# Patient Record
Sex: Male | Born: 1955 | Race: White | Hispanic: No | Marital: Married | Smoking: Never smoker
Health system: Southern US, Community
[De-identification: ages and names within clinical notes are randomized; demographics above are authoritative.]

## PROBLEM LIST (undated history)

## (undated) DIAGNOSIS — N189 Chronic kidney disease, unspecified: Secondary | ICD-10-CM

## (undated) DIAGNOSIS — I10 Essential (primary) hypertension: Secondary | ICD-10-CM

## (undated) DIAGNOSIS — E119 Type 2 diabetes mellitus without complications: Secondary | ICD-10-CM

## (undated) HISTORY — DX: Essential (primary) hypertension: I10

## (undated) HISTORY — DX: Chronic kidney disease, unspecified: N18.9

## (undated) HISTORY — DX: Type 2 diabetes mellitus without complications: E11.9

---

## 2004-05-17 ENCOUNTER — Inpatient Hospital Stay: Payer: Self-pay | Admitting: Internal Medicine

## 2004-07-05 ENCOUNTER — Ambulatory Visit: Payer: Self-pay | Admitting: Internal Medicine

## 2004-07-09 ENCOUNTER — Ambulatory Visit: Payer: Self-pay | Admitting: Orthopaedic Surgery

## 2004-07-25 ENCOUNTER — Other Ambulatory Visit: Payer: Self-pay

## 2004-07-26 ENCOUNTER — Ambulatory Visit: Payer: Self-pay | Admitting: Urology

## 2004-07-28 ENCOUNTER — Inpatient Hospital Stay: Payer: Self-pay | Admitting: Internal Medicine

## 2004-08-05 ENCOUNTER — Ambulatory Visit: Payer: Self-pay | Admitting: Internal Medicine

## 2004-08-23 ENCOUNTER — Ambulatory Visit: Payer: Self-pay | Admitting: Orthopaedic Surgery

## 2004-11-13 ENCOUNTER — Ambulatory Visit: Payer: Self-pay | Admitting: Internal Medicine

## 2005-01-15 ENCOUNTER — Ambulatory Visit: Payer: Self-pay | Admitting: Endocrinology

## 2005-03-06 ENCOUNTER — Other Ambulatory Visit: Payer: Self-pay

## 2005-03-06 ENCOUNTER — Emergency Department: Payer: Self-pay | Admitting: Unknown Physician Specialty

## 2005-06-14 ENCOUNTER — Other Ambulatory Visit: Payer: Self-pay

## 2005-06-14 ENCOUNTER — Inpatient Hospital Stay: Payer: Self-pay | Admitting: Internal Medicine

## 2005-06-15 ENCOUNTER — Other Ambulatory Visit: Payer: Self-pay

## 2005-06-16 ENCOUNTER — Other Ambulatory Visit: Payer: Self-pay

## 2005-06-17 ENCOUNTER — Other Ambulatory Visit: Payer: Self-pay

## 2005-07-01 ENCOUNTER — Ambulatory Visit: Payer: Self-pay | Admitting: Internal Medicine

## 2005-07-10 ENCOUNTER — Ambulatory Visit: Payer: Self-pay | Admitting: Internal Medicine

## 2005-07-10 ENCOUNTER — Other Ambulatory Visit: Payer: Self-pay

## 2005-09-24 ENCOUNTER — Ambulatory Visit: Payer: Self-pay | Admitting: Internal Medicine

## 2005-10-14 ENCOUNTER — Other Ambulatory Visit: Payer: Self-pay

## 2005-10-14 ENCOUNTER — Emergency Department: Payer: Self-pay | Admitting: Emergency Medicine

## 2005-11-19 ENCOUNTER — Ambulatory Visit: Payer: Self-pay | Admitting: Internal Medicine

## 2005-12-17 ENCOUNTER — Ambulatory Visit: Payer: Self-pay | Admitting: Gastroenterology

## 2006-09-04 ENCOUNTER — Ambulatory Visit: Payer: Self-pay | Admitting: Orthopaedic Surgery

## 2006-10-01 ENCOUNTER — Ambulatory Visit: Payer: Self-pay | Admitting: Orthopaedic Surgery

## 2006-12-01 IMAGING — CT CT ABDOMEN WO/W CM
2 of 4 series · 14 of 32 positions shown, 19 images · non-contrast
Comparison: none

REASON FOR EXAM: adrenal tumor   HTN
COMMENTS:

[Series 2: without · axial · non-contrast · 0.75mm/px · z∈[-822,-582]mm · 8 of 40 slices shown, 13 images]
[im 5/40  soft-tissue]
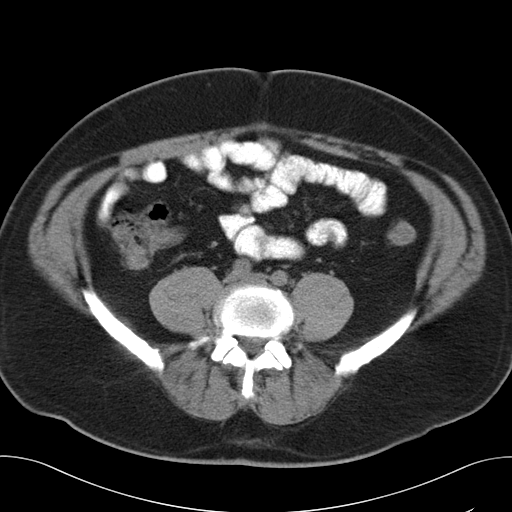
[im 5/40  bone]
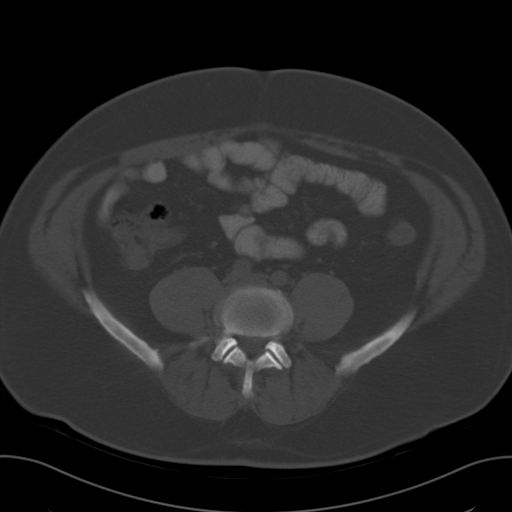
[im 9/40  soft-tissue]
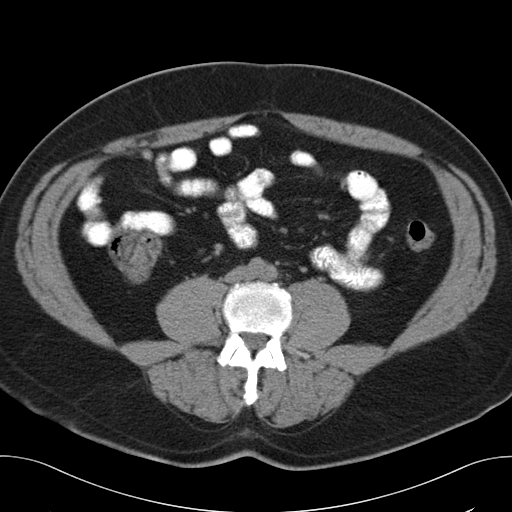
[im 14/40  soft-tissue]
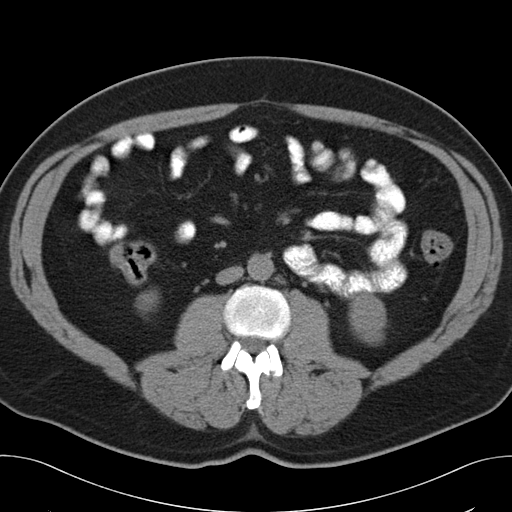
[im 18/40  soft-tissue]
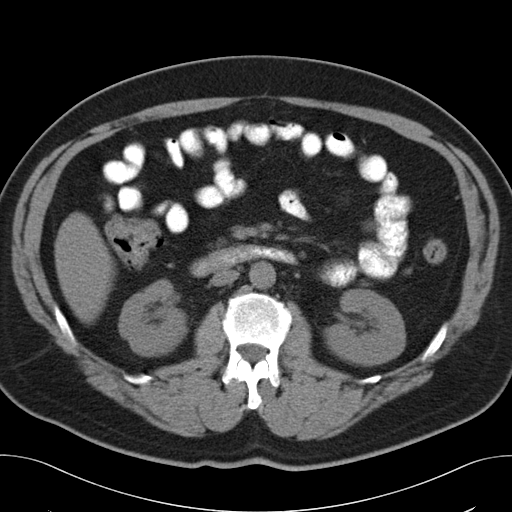
[im 22/40  soft-tissue]
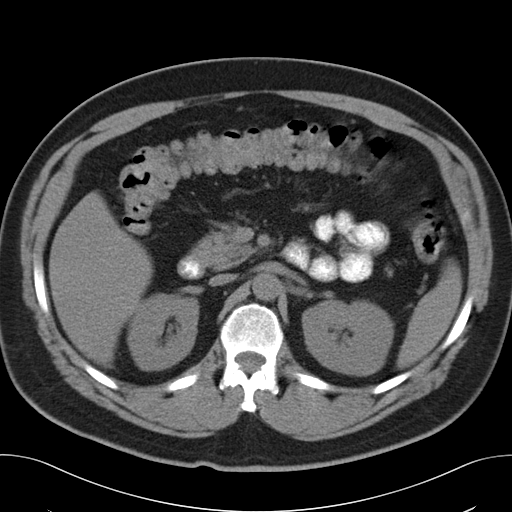
[im 22/40  lung]
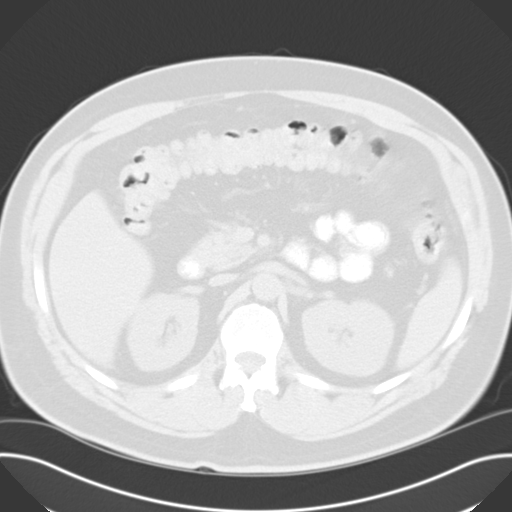
[im 27/40  soft-tissue]
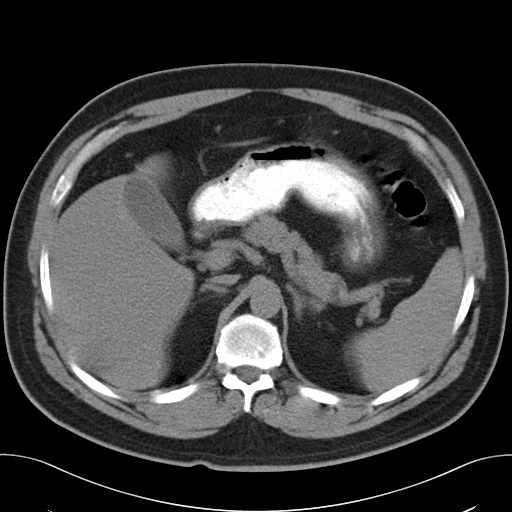
[im 27/40  lung]
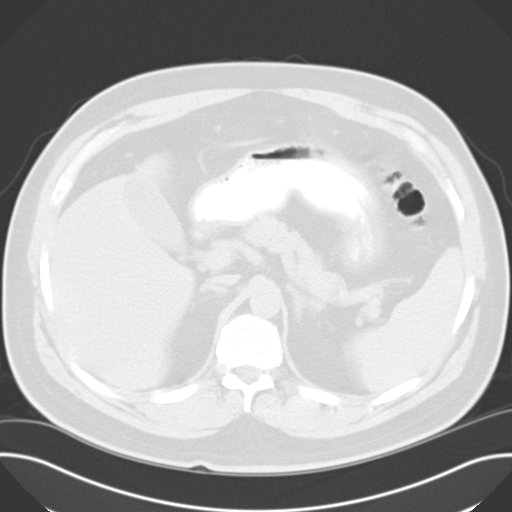
[im 31/40  soft-tissue]
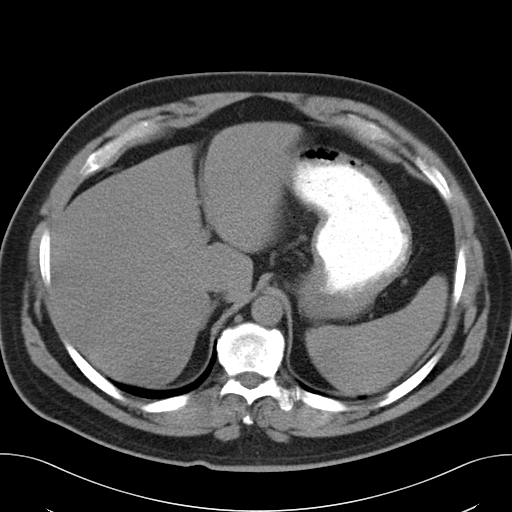
[im 31/40  lung]
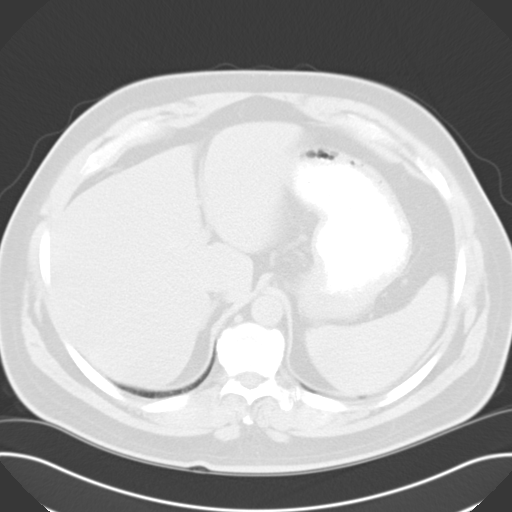
[im 35/40  soft-tissue]
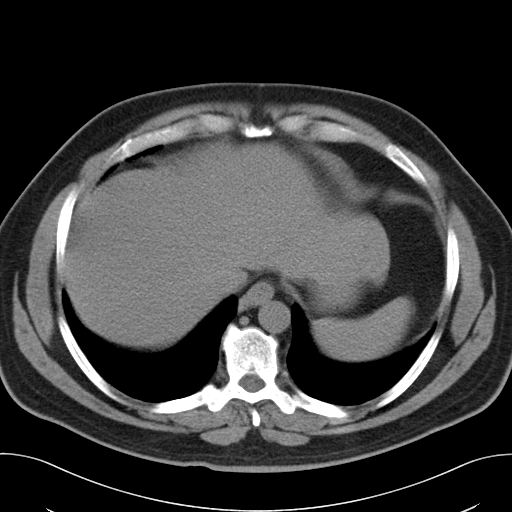
[im 35/40  lung]
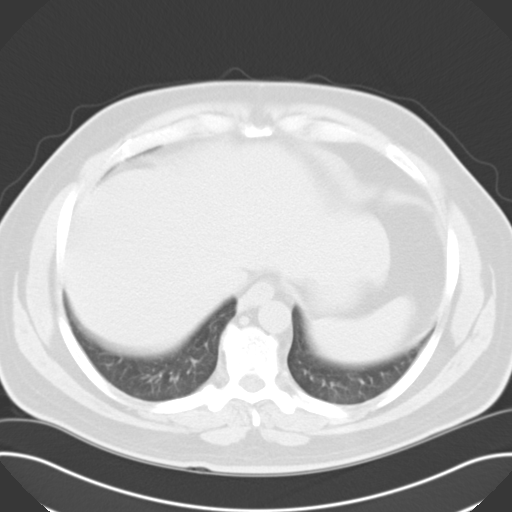

[Series 3: with · axial · 0.75mm/px · z∈[-822,-622]mm · 6 of 40 slices shown]
[im 5/40  soft-tissue]
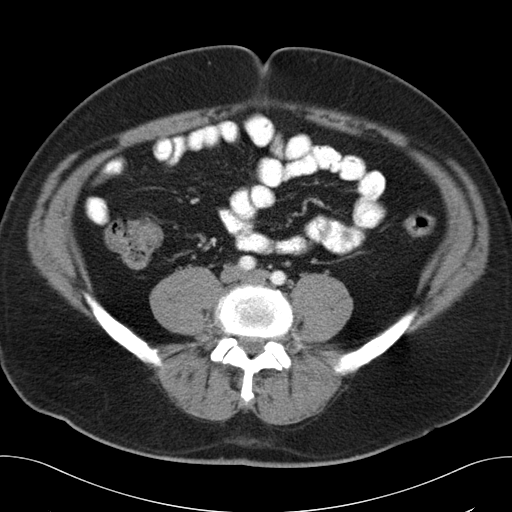
[im 10/40  soft-tissue]
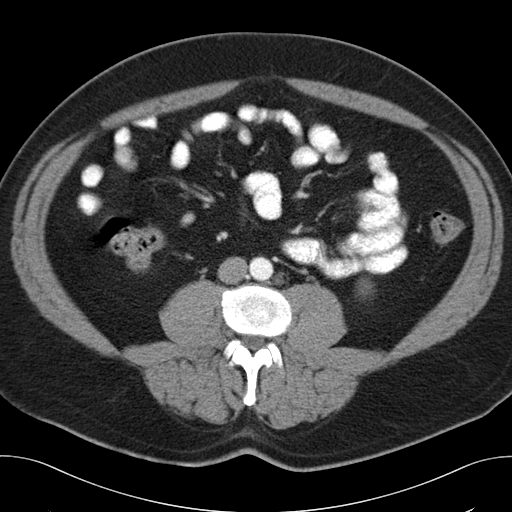
[im 15/40  soft-tissue]
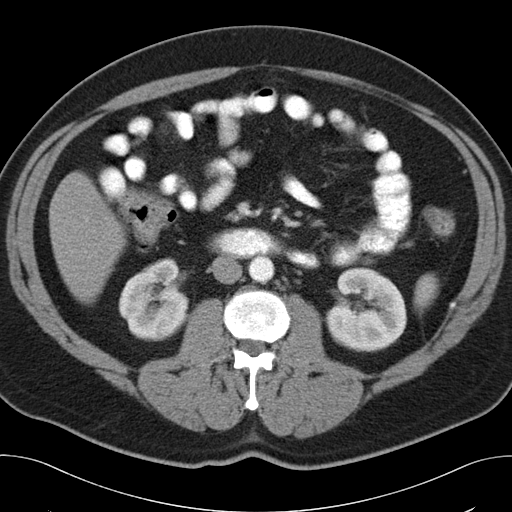
[im 20/40  soft-tissue]
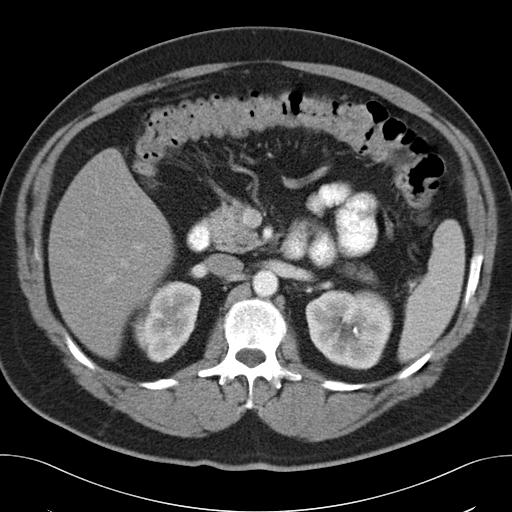
[im 25/40  soft-tissue]
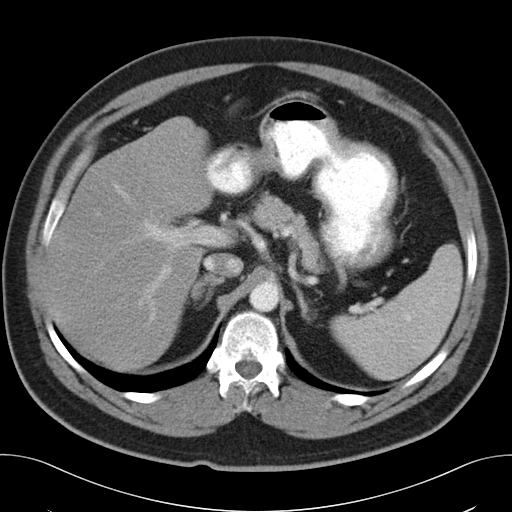
[im 30/40  soft-tissue]
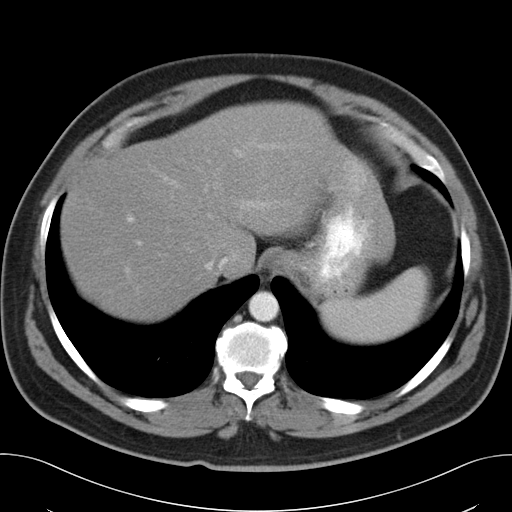

[14 of 32 positions shown; findings below may reference images not displayed]

PROCEDURE:     CT  - CT ABDOMEN STANDARD W/WO  - January 15, 2005  [DATE]

RESULT:          Axial images were obtained from the hemidiaphragm to the
midpelvis without and post intravenous injection of contrast.

There are noted two small LEFT renal calculi in the upper pole.   No renal
calculi are noted on the RIGHT.  No hydronephrosis is detected.  Both
kidneys excrete the contrast material with no obvious masses noted in either
kidney.

The adrenal glands are clearly outlined with no adrenal masses seen.  No
intra-abdominal masses are seen.   No significant abnormalities noted of the
organs identified.
IMPRESSION: LEFT renal calculi identified.  No hydronephrosis is seen.

The adrenal glands are intact. No obvious adrenal mass is seen.

## 2007-02-09 ENCOUNTER — Other Ambulatory Visit: Payer: Self-pay

## 2007-02-09 ENCOUNTER — Emergency Department: Payer: Self-pay

## 2007-03-27 ENCOUNTER — Ambulatory Visit: Payer: Self-pay | Admitting: Internal Medicine

## 2007-12-19 ENCOUNTER — Ambulatory Visit: Payer: Self-pay | Admitting: Sports Medicine

## 2008-01-29 ENCOUNTER — Other Ambulatory Visit: Payer: Self-pay

## 2008-01-29 ENCOUNTER — Ambulatory Visit: Payer: Self-pay | Admitting: Orthopaedic Surgery

## 2008-02-02 ENCOUNTER — Ambulatory Visit: Payer: Self-pay | Admitting: Orthopaedic Surgery

## 2009-10-27 ENCOUNTER — Ambulatory Visit: Payer: Self-pay | Admitting: Internal Medicine

## 2019-10-07 ENCOUNTER — Ambulatory Visit: Payer: 59 | Attending: Internal Medicine

## 2019-10-07 DIAGNOSIS — Z23 Encounter for immunization: Secondary | ICD-10-CM | POA: Insufficient documentation

## 2019-10-07 NOTE — Progress Notes (Signed)
   [id number]  Name:  Adrian Jimenez.    MRN: [id number] DOB: Jun 08, 1956  10/07/2019  [name] was observed post Covid-19 immunization for 15 minutes without incident. He was provided with Vaccine Information Sheet and instruction to access the V-Safe system.   [name] was instructed to call 911 with any severe reactions post vaccine: Marland Kitchen Difficulty breathing  . Swelling of face and throat  . A fast heartbeat  . A bad rash all over body  . Dizziness and weakness   Immunizations Administered    Name Date Dose VIS Date Route   Pfizer COVID-19 Vaccine 10/07/2019  9:58 AM 0.3 mL 07/16/2019 Intramuscular   Manufacturer: [employer], [employer]   Lot: [id number]   NDC: [address]-2

## 2019-10-28 ENCOUNTER — Ambulatory Visit: Payer: 59 | Attending: Internal Medicine

## 2019-10-28 DIAGNOSIS — Z23 Encounter for immunization: Secondary | ICD-10-CM

## 2019-10-28 NOTE — Progress Notes (Signed)
   [id number]  Name:  Adrian Jimenez.    MRN: [id number] DOB: [birth year]  10/28/2019  Adrian Jimenez was observed post Covid-19 immunization for 15 minutes without incident. He was provided with Vaccine Information Sheet and instruction to access the V-Safe system.   Adrian Jimenez was instructed to call 911 with any severe reactions post vaccine: Marland Kitchen Difficulty breathing  . Swelling of face and throat  . A fast heartbeat  . A bad rash all over body  . Dizziness and weakness   Immunizations Administered    Name Date Dose VIS Date Route   Pfizer COVID-19 Vaccine 10/28/2019  9:44 AM 0.3 mL 07/16/2019 Intramuscular   Manufacturer: [employer], [employer]   Lot: [id number]   NDC: [address]-1

## 2019-11-25 ENCOUNTER — Encounter: Payer: Self-pay | Admitting: Urology

## 2019-11-25 ENCOUNTER — Ambulatory Visit (INDEPENDENT_AMBULATORY_CARE_PROVIDER_SITE_OTHER): Payer: 59 | Admitting: Urology

## 2019-11-25 ENCOUNTER — Other Ambulatory Visit: Payer: Self-pay

## 2019-11-25 VITALS — BP 158/89 | HR 75 | Ht 67.0 in | Wt 235.0 lb

## 2019-11-25 DIAGNOSIS — N400 Enlarged prostate without lower urinary tract symptoms: Secondary | ICD-10-CM | POA: Diagnosis not present

## 2019-11-25 NOTE — Progress Notes (Signed)
11/25/2019 8:33 AM   [name] 10/18/1955 [id number]  Referring provider: Su Monks, PA [address] [place],  [place] [address]  Chief Complaint  Patient presents with  . Other    HPI: Mehtab, Dolberry. is a 64 YO male seen at the request of Sallyanne Havers, PA for evaluation of hypogonadism  -1 year history tiredness, fatigue, mild decreased libido -No erectile dysfunction -Denies headache, visual changes, breast changes or lower urinary tract symptoms -Total testosterone level 10/2019 low at 161   PMH: Past Medical History:  Diagnosis Date  . Chronic kidney disease   . Diabetes mellitus without complication (HCC)   . Hypertension     Surgical History: History reviewed. No pertinent surgical history.  Home Medications:  Allergies as of 11/25/2019      Reactions   Oxycodone-acetaminophen Itching   Atorvastatin    Other reaction(s): Muscle Pain   Clonidine    Other reaction(s): Headache Extreme fatigue, dizzines   Meperidine    Other reaction(s): Hallucination   Simvastatin    Other reaction(s): Muscle Pain      Medication List       Accurate as of November 25, 2019  8:33 AM. If you have any questions, ask your nurse or doctor.        aspirin 81 MG chewable tablet Chew by mouth.   carvedilol 25 MG tablet Commonly known as: COREG Take 1 tablet by mouth twice daily   cyanocobalamin 1000 MCG/ML injection Commonly known as: (VITAMIN B-12) Inject into the muscle.   furosemide 20 MG tablet Commonly known as: LASIX Take by mouth.   hydrALAZINE 50 MG tablet Commonly known as: APRESOLINE Take 2 tablets by mouth twice daily   lisinopril 20 MG tablet Commonly known as: ZESTRIL Take 1 tablet by mouth twice daily   metFORMIN 500 MG 24 hr tablet Commonly known as: GLUCOPHAGE-XR Take by mouth.   multivitamin capsule Take by mouth.   nitroGLYCERIN 0.2 mg/hr patch Commonly known as: NITRODUR - Dosed in mg/24 hr LEAVE PATCHES ON  FOR 12 TO 14 HOURS, THEN REMOVE FOR 10 TO 12 HOURS PRIOR TO APPLYING NEXT PATCH.   omeprazole 20 MG capsule Commonly known as: PRILOSEC Take by mouth.   rosuvastatin 5 MG tablet Commonly known as: CRESTOR Take 1 tablet by mouth once daily   spironolactone 25 MG tablet Commonly known as: ALDACTONE Take 1/2 (one-half) tablet by mouth once daily       Allergies:  Allergies  Allergen Reactions  . Oxycodone-Acetaminophen Itching  . Atorvastatin     Other reaction(s): Muscle Pain  . Clonidine     Other reaction(s): Headache Extreme fatigue, dizzines  . Meperidine     Other reaction(s): Hallucination  . Simvastatin     Other reaction(s): Muscle Pain    Family History: History reviewed. No pertinent family history.  Social History:  has no history on file for tobacco, alcohol, and drug.   Physical Exam: BP (!) 158/89   Pulse 75   Ht 5\' 7"  (1.702 m)   Wt 235 lb (106.6 kg)   BMI 36.81 kg/m   Constitutional:  Alert and oriented, No acute distress. HEENT: [place] AT, moist mucus membranes.  Trachea midline, no masses. Cardiovascular: No clubbing, cyanosis, or edema. Respiratory: Normal respiratory effort, no increased work of breathing. GI: Abdomen is soft, nontender, nondistended, no abdominal masses GU: Phallus without lesions, testes descended bilaterally without masses or tenderness, normal size.  Spermatic cord/epididymis palpably normal bilaterally.  Lymph: No cervical or inguinal lymphadenopathy. Skin: No rashes, bruises or suspicious lesions. Neurologic: Grossly intact, no focal deficits, moving all 4 extremities. Psychiatric: Normal mood and affect.   Assessment & Plan:    - Hypogonadism Symptomatic hypogonadism.  He is interested in TRT. Potential side effects of testosterone replacement were discussed including stimulation of benign prostatic growth with lower urinary tract symptoms; erythrocytosis; edema; gynecomastia; worsening sleep apnea; venous  thromboembolism; testicular atrophy and infertility. Recent studies suggesting an increased incidence of heart attack and stroke in patients taking testosterone was discussed. He was informed there is conflicting evidence regarding the impact of testosterone therapy on cardiovascular risk. The theoretical risk of growth stimulation of an undetected prostate cancer was also discussed.  He was informed that current evidence does not provide any definitive answers regarding the risks of testosterone therapy on prostate cancer and cardiovascular disease. The need for periodic monitoring of his testosterone level, PSA, hematocrit and DRE was discussed.  We discussed that insurance companies typically require 2 AM abnormal levels before covering.  Repeat testosterone levels drawn today along with an LH, hematocrit and PSA.  If covered by insurance he is primarily interested in [place].   Abbie Sons, [facility] [address] [place], [place] [address] [phone]

## 2019-11-26 LAB — PSA: Prostate Specific Ag, Serum: 2.1 ng/mL (ref 0.0–4.0)

## 2019-11-26 LAB — TESTOSTERONE: Testosterone: 147 ng/dL — ABNORMAL LOW (ref 264–916)

## 2019-11-26 LAB — LUTEINIZING HORMONE: LH: 13.9 m[IU]/mL — ABNORMAL HIGH (ref 1.7–8.6)

## 2019-11-26 LAB — HEMATOCRIT: Hematocrit: 39.4 % (ref 37.5–51.0)

## 2019-11-27 ENCOUNTER — Encounter: Payer: Self-pay | Admitting: Urology

## 2019-11-28 ENCOUNTER — Other Ambulatory Visit: Payer: Self-pay | Admitting: Urology

## 2019-11-28 MED ORDER — TESTOSTERONE CYPIONATE 200 MG/ML IM SOLN: 200.0000 mg | INTRAMUSCULAR | 0 refills | Status: DC

## 2019-11-29 ENCOUNTER — Telehealth: Payer: Self-pay | Admitting: *Deleted

## 2019-11-29 NOTE — Telephone Encounter (Signed)
Notified patient as instructed, patient pleased. Discussed follow-up appointments, patient agrees  

## 2019-11-29 NOTE — Telephone Encounter (Signed)
-----   Message from Riki Altes, MD sent at 11/28/2019  9:31 AM EDT ----- Regarding: Testosterone Barbaraann Cao is not on his preferred formulary.  Rx testosterone cypionate was sent to [facility].  Need to schedule office injection and injection training if he desires to administer his own injections.   He will also need a 6-week follow-up visit with me with a testosterone level prior

## 2019-11-29 NOTE — Telephone Encounter (Signed)
-----   Message from Riki Altes, MD sent at 11/28/2019  9:26 AM EDT ----- Repeat testosterone low at 147.  Rx [place] sent to [facility] and will see if insurance will approve.

## 2019-12-03 ENCOUNTER — Other Ambulatory Visit: Payer: Self-pay

## 2019-12-03 ENCOUNTER — Ambulatory Visit (INDEPENDENT_AMBULATORY_CARE_PROVIDER_SITE_OTHER): Payer: 59 | Admitting: Physician Assistant

## 2019-12-03 ENCOUNTER — Encounter: Payer: Self-pay | Admitting: Physician Assistant

## 2019-12-03 VITALS — BP 162/75 | HR 73

## 2019-12-03 DIAGNOSIS — E291 Testicular hypofunction: Secondary | ICD-10-CM

## 2019-12-03 MED ORDER — "NEEDLE (DISP) 18G X 1"" MISC": 0 refills | Status: AC

## 2019-12-03 MED ORDER — "BD ECLIPSE NEEDLE 21G X 1"" MISC": 0 refills | Status: AC

## 2019-12-03 MED ORDER — SYRINGE (DISPOSABLE) 3 ML MISC: 0 refills | Status: DC

## 2019-12-03 MED ORDER — TESTOSTERONE CYPIONATE 200 MG/ML IM SOLN
200.0000 mg | Freq: Once | INTRAMUSCULAR | Status: AC
  Administered 2019-12-03: 200 mg via INTRAMUSCULAR

## 2019-12-03 NOTE — Progress Notes (Signed)
Testosterone patient teaching  Patient presents today for testosterone injection teaching.  Patient was instructed to use an 18-gauge needle to draw up 1 cc of the testosterone into a 3 cc syringe, then change to a 21-gauge needle for injection.  Patient then cleaned the upper outer quadrant of the right buttock with an alcohol swab and patient's wife injected the site with the bevel up.  Medication: Testosterone Cypionate Dose: 200mg  (73mL) Location: right upper outer buttocks Lot: 0m Exp:05/2021  Patient verbalized understanding to inject 1 mL every 14 days unless instructed otherwise by a provider and obtain testosterone level labs in the future only at the midpoint between injection days.  Patient tolerated well.  Performed by: 06/2021, PA-C   Follow up: 6 weeks for symptom recheck with testosterone prior with Dr. Carman Ching.

## 2019-12-03 NOTE — Patient Instructions (Signed)
Intramuscular Injection Instructions Using a Syringe and Vial, Adult An intramuscular (IM) injection is a shot of medicine that is given into a muscle. An IM injection may be given when medicine needs to start working right away, or when medicine cannot be given any other way. You will use a single-use syringe to give the IM injection. The medicine comes in a bottle (vial). You will fill the syringe with medicine from the vial. Before your first injection, your health care provider will show you or your caregiver how to do the injection at home. Use only the syringe, needle, and medicine that your health care provider prescribes. Use each syringe and needle only one time. What are the risks? With proper training, this is a safe procedure. However, problems may occur. Common problems include pain and bruising at the injection site. Other problems may include:  Bleeding.  Nerve injury.  Infection.  The medicine going into the blood stream (intravenous administration).  Muscle injury from the injections.  Allergic reaction to the medicine. Supplies needed:  Medication guide or package insert that came with the medicine prescribed by your health care provider. ? Follow directions from the guide about how to prepare and give the injection. This is important because the directions may be different for each medicine.  Medicine prescribed by your health care provider.  Syringe.  Needle. Use the needle length and size (gauge) that your health care provider or pharmacist gives to you. Do not reuse needles. Use each needle only once.  Alcohol wipes.  Bandage.  A container for syringe disposal. ? This may be a puncture-proof sharps container or a hard plastic container that has a secure lid, such as an empty laundry detergent bottle. How to choose a site for injection Follow instructions from your health care provider about where to give the injection. Generally:  The hip is the preferred  injection site.  The injection may also be given in the upper arm or outer thigh, depending on the amount of medicine in the injection and your muscle mass. Do not inject the medicine in the same spot each time. Do not inject over areas of skin that are open, infected, or bruised. How to locate an injection site Refer to the following instructions to find the exact location of each injection site. Outer thigh  1. Imagine that the thigh is divided into three equal sections (thirds) between the knee and the hip. 2. The injection site area is in the middle third, on the outer side of the thigh. Upper arm  1. Find the bony point at the top of the arm (acromion process). Place two fingers just under the acromion process. 2. Under the two fingers, picture an upside-down triangle over the arm muscle. The injection site is in the middle of the triangle. Hip  1. Place the palm of your hand on the outside of the hip so your wrist is at the top of the upper leg. 2. Your thumb should point toward the groin. 3. Use your ring finger and little finger to feel the upper edge of the pelvic bone. 4. Spread your index finger and middle finger apart from each other into a "V" shape. 5. The injection site is the area between those fingers. Preparing to give an IM injection 1. Check the medicine and syringe for any damage. If you notice any of the following, do not give the medicine and contact your pharmacy or health care provider. ? Cracked glass vial. ? Cloudy medicine. ?   Clumps or solids floating in the medicine. 2. Get in a comfortable position so that the muscle site is relaxed. This depends on the site of the injection. How to give an IM injection using medicine from a vial 1. Wash your hands with soap and water. If soap and water are not available, use hand sanitizer. 2. Use an alcohol wipe to clean the site where you will be injecting the needle. Let the site air-dry. 3. Gently roll the medicine  vial between your hands to mix it. Do not shake the vial. 4. If there is a plastic covering on the vial, remove it. Clean the top rubber part of the vial with an alcohol wipe. 5. Remove the plastic cover from the needle on the syringe. Do not let the needle touch anything. 6. Pull the plunger back to draw air into the syringe. Stop the plunger when the dose indicator gets to the line that is the same as your medicine dose. 7. Push the needle through the rubber on the top of the vial. Do not turn the vial over. 8. Push the plunger in all the way. This pushes air into the vial. 9. While the needle is still in the vial, turn the vial upside down and hold it at eye level. 10. Pull back slowly on the plunger to draw medicine into the syringe. Stop when the dose indicator is at the correct amount of medicine needed. 11. Remove the needle from the vial. Do not let the needle touch anything. 12. Hold the syringe with the needle pointing up. Check the syringe for any remaining air bubbles. If there are air bubbles, flick the syringe with your finger until the air bubbles rise to the top. Then, gently push on the plunger until you can see a drop of medicine appear at the tip of the needle. This will clear any remaining air bubbles from the syringe. 13. Hold the syringe in your writing hand like a pencil. 14. Insert the entire needle straight into the injection site. The needle should be at a 90-degree angle (perpendicular) to the skin. Push the needle all the way through the skin and into the muscle. Continue to hold the syringe with your writing hand. ? If instructed, use your other hand to pull back slightly on the plunger (aspirate) to see if any blood enters the syringe. If there is blood, do not inject the medicine. Remove the needle and start over with a new needle and syringe. Most medicines will not require that you do this step. 15. Use the thumb of your writing hand to push the plunger until the syringe  is empty. If the medicine is a gel, you may need to push harder on the plunger to press the medicine out of the syringe and into the muscle. 16. Pull the needle straight out of the skin. 17. Press and hold the alcohol wipe over the injection site until bleeding stops. Do not rub the area. 18. Cover the injection site with a bandage, if needed. 19. Write down the date, time, and location of each injection that you give. This is important. 20. If more than one injection is needed, give an injection at least 1 inch (2.5 cm) away from the previous injection site. How to safely throw away the supplies If you are using a syringe that does not have a safety system for shielding the needle after injection:  Do not recap the needle. Place the syringe and needle in the disposal container.   If your syringe has a safety system for shielding the needle after injection:  Firmly push down on the plunger after you complete the injection. The protective sleeve will automatically cover the needle, and you will hear a click. The click means that the needle is safely covered. Follow the disposal regulations for the area where you live. Do not use any syringe or needle more than one time. You may throw the empty medicine vial in the trash. Contact a health care provider if:  You have difficulty giving the injection.  You think that the injection was not given correctly.  You have difficulty with any of the supplies.  The medicine causes side effects.  Rashes develop on the skin.  A fever develops.  The condition that is being treated gets worse. Get help right away if:  Any of these symptoms develop after the injection is given: ? Difficulty breathing. ? Chest pain. ? A rash over most or all of the body. ? Swelling of the lips or tongue. ? Difficulty swallowing. Summary  An intramuscular (IM) injection is a shot of medicine that is given into a muscle.  Use only the syringe, needle, and medicine  that your health care provider prescribes. Use each syringe and needle only one time.  Follow instructions from your health care provider about where to give an injection. This information is not intended to replace advice given to you by your health care provider. Make sure you discuss any questions you have with your health care provider. Document Revised: 11/26/2017 Document Reviewed: 11/26/2017 Elsevier Patient Education  2020 [employer]  

## 2019-12-29 ENCOUNTER — Other Ambulatory Visit: Payer: Self-pay

## 2019-12-29 DIAGNOSIS — E291 Testicular hypofunction: Secondary | ICD-10-CM

## 2019-12-30 ENCOUNTER — Encounter (INDEPENDENT_AMBULATORY_CARE_PROVIDER_SITE_OTHER): Payer: Self-pay

## 2019-12-30 ENCOUNTER — Telehealth: Payer: Self-pay | Admitting: Urology

## 2019-12-30 ENCOUNTER — Other Ambulatory Visit: Payer: 59

## 2019-12-30 ENCOUNTER — Other Ambulatory Visit: Payer: Self-pay | Admitting: Urology

## 2019-12-30 ENCOUNTER — Other Ambulatory Visit: Payer: Self-pay

## 2019-12-30 DIAGNOSIS — E291 Testicular hypofunction: Secondary | ICD-10-CM

## 2019-12-30 NOTE — Telephone Encounter (Signed)
Pt  came in for labs today he would like to switch his apt for 01/17/20 from in office visit to a televisit  For 01/17/20 [place]. Just wanted to make that was ok?

## 2020-01-04 LAB — TESTOSTERONE: Testosterone: 427 ng/dL (ref 264–916)

## 2020-01-17 ENCOUNTER — Other Ambulatory Visit: Payer: Self-pay

## 2020-01-17 ENCOUNTER — Telehealth (INDEPENDENT_AMBULATORY_CARE_PROVIDER_SITE_OTHER): Payer: 59 | Admitting: Urology

## 2020-01-18 ENCOUNTER — Telehealth: Payer: Self-pay | Admitting: Urology

## 2020-01-18 NOTE — Progress Notes (Signed)
Call patient for virtual visit and got voicemail

## 2020-01-18 NOTE — Telephone Encounter (Signed)
Please contact patient and let him know his testosterone level drawn on 5/27 was significantly better at 427.  Please find out:  -When was his last injection in relation to that blood draw -If his symptoms have improved on replacement with his primary symptoms being tiredness and fatigue

## 2020-01-19 NOTE — Telephone Encounter (Signed)
Notified patient as instructed, patient pleased. Discussed follow-up appointments, patient agrees  Patient last injection was 12/23/2019 and lad was 12/30/2019. He states he is felling much better.

## 2020-01-19 NOTE — Telephone Encounter (Signed)
Please schedule a follow-up appointment September 2021 with a testosterone, hematocrit and PSA prior

## 2020-01-28 ENCOUNTER — Other Ambulatory Visit: Payer: Self-pay

## 2020-01-28 NOTE — Telephone Encounter (Signed)
Patients wife called asking if we can resend his testosterone into a different pharmacy.

## 2020-02-01 MED ORDER — TESTOSTERONE CYPIONATE 200 MG/ML IM SOLN: 200.0000 mg | INTRAMUSCULAR | 0 refills | Status: DC

## 2020-04-17 ENCOUNTER — Encounter: Payer: Self-pay | Admitting: Urology

## 2020-04-17 ENCOUNTER — Other Ambulatory Visit: Payer: Self-pay

## 2020-04-17 DIAGNOSIS — E291 Testicular hypofunction: Secondary | ICD-10-CM

## 2020-04-21 ENCOUNTER — Ambulatory Visit: Payer: Self-pay | Admitting: Urology

## 2020-05-05 ENCOUNTER — Ambulatory Visit: Payer: Self-pay | Admitting: Urology

## 2020-07-29 ENCOUNTER — Other Ambulatory Visit: Payer: Self-pay | Admitting: Urology

## 2020-08-07 ENCOUNTER — Ambulatory Visit: Payer: Self-pay | Admitting: Urology

## 2020-08-14 ENCOUNTER — Ambulatory Visit: Payer: Self-pay | Admitting: Urology

## 2020-10-04 NOTE — Progress Notes (Incomplete)
10/04/2020 6:17 PM   Adrian Jimenez 1955-12-20 [id number]  Referring provider: Su Monks, PA [address] [place],  [place] [address] No chief complaint on file.   HPI: Adrian Jimenez. is a 65 y.o. male who presents today for an annual follow up.   -Patient saw Adrian Capes, PA-C on 12/03/2019 for testosterone teaching. -Patient reports/denies tiredness and fatigue *** -His libido has improved/remains stable *** -Denies headache, visual changes, breast changes or lower urinary tract symptoms -Labs on xx/xx/xxxx showed hematocrit ***, test *** -Most recent PSA is ***     PMH: Past Medical History:  Diagnosis Date  . Chronic kidney disease   . Diabetes mellitus without complication (HCC)   . Hypertension     Surgical History: No past surgical history on file.  Home Medications:  Allergies as of 10/09/2020      Reactions   Oxycodone-acetaminophen Itching   Atorvastatin    Other reaction(s): Muscle Pain   Clonidine    Other reaction(s): Headache Extreme fatigue, dizzines   Meperidine    Other reaction(s): Hallucination   Simvastatin    Other reaction(s): Muscle Pain      Medication List       Accurate as of October 04, 2020  6:17 PM. If you have any questions, ask your nurse or doctor.        aspirin 81 MG chewable tablet Chew by mouth.   BD Eclipse Needle 21G X 1" Misc Generic drug: NEEDLE (DISP) 21 G Use to inject medication   carvedilol 25 MG tablet Commonly known as: COREG Take 1 tablet by mouth twice daily   furosemide 20 MG tablet Commonly known as: LASIX Take by mouth.   hydrALAZINE 50 MG tablet Commonly known as: APRESOLINE Take 2 tablets by mouth twice daily   lisinopril 20 MG tablet Commonly known as: ZESTRIL Take 1 tablet by mouth twice daily   metFORMIN 500 MG 24 hr tablet Commonly known as: GLUCOPHAGE-XR Take by mouth.   multivitamin capsule Take by mouth.   NEEDLE (DISP) 18 G 18G X 1" Misc Use  to draw up medication   nitroGLYCERIN 0.2 mg/hr patch Commonly known as: NITRODUR - Dosed in mg/24 hr LEAVE PATCHES ON FOR 12 TO 14 HOURS, THEN REMOVE FOR 10 TO 12 HOURS PRIOR TO APPLYING NEXT PATCH.   omeprazole 20 MG capsule Commonly known as: PRILOSEC Take by mouth.   rosuvastatin 5 MG tablet Commonly known as: CRESTOR Take 1 tablet by mouth once daily   spironolactone 25 MG tablet Commonly known as: ALDACTONE Take 1/2 (one-half) tablet by mouth once daily   Syringe (Disposable) 3 ML Misc Use for injections   testosterone cypionate 200 MG/ML injection Commonly known as: DEPOTESTOSTERONE CYPIONATE INJECT 1 ML INTRAMUSCULARLY AS DIRECTED EVERY  14  DAYS       Allergies:  Allergies  Allergen Reactions  . Oxycodone-Acetaminophen Itching  . Atorvastatin     Other reaction(s): Muscle Pain  . Clonidine     Other reaction(s): Headache Extreme fatigue, dizzines  . Meperidine     Other reaction(s): Hallucination  . Simvastatin     Other reaction(s): Muscle Pain    Family History: No family history on file.  Social History:  reports that he has never smoked. He has never used smokeless tobacco. He reports current alcohol use. No history on file for drug use.   Physical Exam: There were no vitals taken for this visit.  Constitutional:  Alert and oriented,  No acute distress. HEENT: [place] AT, moist mucus membranes.  Trachea midline, no masses. Cardiovascular: No clubbing, cyanosis, or edema. Respiratory: Normal respiratory effort, no increased work of breathing. Skin: No rashes, bruises or suspicious lesions. Neurologic: Grossly intact, no focal deficits, moving all 4 extremities. Psychiatric: Normal mood and affect.  Laboratory Data:  Lab Results  Component Value Date   TESTOSTERONE 427 12/30/2019    Assessment & Plan:    - Hypogonadism  HCT 38.4, test. 2.3, PSA was 5.6. RTC in 6 month for HCT, PSA, and test only. RTC in 1 year for annual follow up  No  follow-ups on file.    [facility] [address] [place], [place] [address] [phone]

## 2020-10-09 ENCOUNTER — Ambulatory Visit: Payer: Self-pay | Admitting: Urology

## 2020-10-19 ENCOUNTER — Ambulatory Visit (INDEPENDENT_AMBULATORY_CARE_PROVIDER_SITE_OTHER): Payer: 59 | Admitting: Urology

## 2020-10-19 ENCOUNTER — Other Ambulatory Visit: Payer: Self-pay

## 2020-10-19 ENCOUNTER — Encounter: Payer: Self-pay | Admitting: Urology

## 2020-10-19 VITALS — BP 162/88 | HR 73 | Ht 67.0 in | Wt 217.0 lb

## 2020-10-19 DIAGNOSIS — E291 Testicular hypofunction: Secondary | ICD-10-CM | POA: Diagnosis not present

## 2020-10-19 NOTE — Progress Notes (Signed)
10/19/2020 10:20 AM   Adrian Jimenez 09-06-1955 [id number]  Referring provider: Su Monks, PA [address] [place],  [place] [address]  Chief Complaint  Patient presents with  . Hypogonadism    HPI: 65 y.o. male presents for follow-up of hypogonadism.   Initially seen 11/25/2019 and was started on testosterone cypionate 200 mg every 2 weeks  Symptoms tiredness, fatigue, decreased libido  Has not followed up since starting testosterone and a level after starting injections may 2021 was 427  Symptoms have significantly improved on TRT however he notes increasing symptoms the week after his injection  No bothersome LUTS  No breast tenderness/enlargement   PMH: Past Medical History:  Diagnosis Date  . Chronic kidney disease   . Diabetes mellitus without complication (HCC)   . Hypertension     Surgical History: No past surgical history on file.  Home Medications:  Allergies as of 10/19/2020      Reactions   Oxycodone-acetaminophen Itching   Atorvastatin    Other reaction(s): Muscle Pain   Clonidine    Other reaction(s): Headache Extreme fatigue, dizzines   Meperidine    Other reaction(s): Hallucination   Simvastatin    Other reaction(s): Muscle Pain      Medication List       Accurate as of October 19, 2020 10:20 AM. If you have any questions, ask your nurse or doctor.        aspirin 81 MG chewable tablet Chew by mouth.   BD Eclipse Needle 21G X 1" Misc Generic drug: NEEDLE (DISP) 21 G Use to inject medication   carvedilol 25 MG tablet Commonly known as: COREG Take 1 tablet by mouth twice daily   furosemide 20 MG tablet Commonly known as: LASIX Take by mouth.   hydrALAZINE 50 MG tablet Commonly known as: APRESOLINE Take 2 tablets by mouth twice daily   lisinopril 20 MG tablet Commonly known as: ZESTRIL Take 1 tablet by mouth twice daily   metFORMIN 500 MG 24 hr tablet Commonly known as: GLUCOPHAGE-XR Take by mouth.    multivitamin capsule Take by mouth.   NEEDLE (DISP) 18 G 18G X 1" Misc Use to draw up medication   nitroGLYCERIN 0.2 mg/hr patch Commonly known as: NITRODUR - Dosed in mg/24 hr LEAVE PATCHES ON FOR 12 TO 14 HOURS, THEN REMOVE FOR 10 TO 12 HOURS PRIOR TO APPLYING NEXT PATCH.   omeprazole 20 MG capsule Commonly known as: PRILOSEC Take by mouth.   rosuvastatin 5 MG tablet Commonly known as: CRESTOR Take 1 tablet by mouth once daily   spironolactone 25 MG tablet Commonly known as: ALDACTONE Take 1/2 (one-half) tablet by mouth once daily   Syringe (Disposable) 3 ML Misc Use for injections   testosterone cypionate 200 MG/ML injection Commonly known as: DEPOTESTOSTERONE CYPIONATE INJECT 1 ML INTRAMUSCULARLY AS DIRECTED EVERY  14  DAYS       Allergies:  Allergies  Allergen Reactions  . Oxycodone-Acetaminophen Itching  . Atorvastatin     Other reaction(s): Muscle Pain  . Clonidine     Other reaction(s): Headache Extreme fatigue, dizzines  . Meperidine     Other reaction(s): Hallucination  . Simvastatin     Other reaction(s): Muscle Pain    Family History: No family history on file.  Social History:  reports that he has never smoked. He has never used smokeless tobacco. He reports current alcohol use. No history on file for drug use.   Physical Exam: BP (!) 162/88  Pulse 73   Ht 5\' 7"  (1.702 m)   Wt 217 lb (98.4 kg)   BMI 33.99 kg/m   Constitutional:  Alert and oriented, No acute distress. HEENT:  AT, moist mucus membranes.  Trachea midline, no masses. Cardiovascular: No clubbing, cyanosis, or edema. Respiratory: Normal respiratory effort, no increased work of breathing. GI: Abdomen is soft, nontender, nondistended, no abdominal masses GU: Prostate 40 g, smooth without nodules Neurologic: Grossly intact, no focal deficits, moving all 4 extremities. Psychiatric: Normal mood and affect.   Assessment & Plan:    1. Hypogonadism in male  Symptomatic  improvement on TRT  Testosterone, PSA, hematocrit drawn today  Notes increase symptoms the week after his injection and he would like to change to weekly injections at a lower dose  Will await to check his midcycle testosterone level before determining dose  53-month lab visit for testosterone/hematocrit  Annual follow-up for testosterone, hematocrit, DRE   8-month, MD  [facility] [address] [place], [place] [phone]

## 2020-10-20 LAB — TESTOSTERONE: Testosterone: 290 ng/dL (ref 264–916)

## 2020-10-20 LAB — HEMATOCRIT: Hematocrit: 45.8 % (ref 37.5–51.0)

## 2020-10-20 LAB — PSA: Prostate Specific Ag, Serum: 3.7 ng/mL (ref 0.0–4.0)

## 2020-10-24 ENCOUNTER — Telehealth: Payer: Self-pay | Admitting: Urology

## 2020-10-24 MED ORDER — TESTOSTERONE CYPIONATE 200 MG/ML IM SOLN: 150.0000 mg | INTRAMUSCULAR | 0 refills | Status: DC

## 2020-10-24 NOTE — Telephone Encounter (Signed)
Testosterone level is low normal at 290; PSA stable 3.7 and hematocrit was normal.  Would change testosterone dose to 150 mg weekly (between 0.7 and 0.8 cc)  Schedule a testosterone level lab visit ~ 6 weeks towards the end of his weekly cycle

## 2020-10-25 NOTE — Telephone Encounter (Signed)
Notified patient as instructed, patient pleased. Discussed follow-up appointments, patient agrees  

## 2020-11-09 ENCOUNTER — Telehealth: Payer: Self-pay

## 2020-11-09 ENCOUNTER — Other Ambulatory Visit: Payer: Self-pay

## 2020-11-09 ENCOUNTER — Telehealth: Payer: Self-pay | Admitting: Urology

## 2020-11-09 DIAGNOSIS — E291 Testicular hypofunction: Secondary | ICD-10-CM

## 2020-11-09 MED ORDER — SYRINGE (DISPOSABLE) 3 ML MISC: 0 refills | Status: AC

## 2020-11-09 MED ORDER — "BD ECLIPSE SHIELDED NEEDLE 22G X 1"" MISC": 0 refills | Status: AC

## 2020-11-09 NOTE — Telephone Encounter (Signed)
Refill previously sent.

## 2020-11-09 NOTE — Telephone Encounter (Signed)
Pt called asking for syringes and needle refills sent to pharmacy in [facility]. Prescriptions sent. Pt aware.

## 2020-11-09 NOTE — Telephone Encounter (Signed)
Pt. LVM needing a RX for syringes for his testosterone injections.

## 2020-11-21 ENCOUNTER — Other Ambulatory Visit: Payer: Self-pay | Admitting: *Deleted

## 2020-11-21 DIAGNOSIS — E291 Testicular hypofunction: Secondary | ICD-10-CM

## 2020-11-29 ENCOUNTER — Other Ambulatory Visit: Payer: 59

## 2020-11-29 ENCOUNTER — Telehealth: Payer: Self-pay | Admitting: *Deleted

## 2020-11-29 ENCOUNTER — Other Ambulatory Visit: Payer: Self-pay

## 2020-11-29 DIAGNOSIS — E291 Testicular hypofunction: Secondary | ICD-10-CM

## 2020-11-29 NOTE — Telephone Encounter (Signed)
Patient would like some ED medication sent. Patient state he can do a phone call appt also if needed.

## 2020-11-29 NOTE — Telephone Encounter (Signed)
Nitroglycerin patches are listed on his med list as an active medication.  Is he currently using these patches?

## 2020-11-30 ENCOUNTER — Telehealth: Payer: Self-pay | Admitting: Urology

## 2020-11-30 LAB — TESTOSTERONE: Testosterone: 398 ng/dL (ref 264–916)

## 2020-11-30 NOTE — Telephone Encounter (Signed)
Patient is taking 0.75 weekly on Sunday . Patient states he took his last injection Sunday and his appt for labs was Wednesday .

## 2020-11-30 NOTE — Telephone Encounter (Signed)
Testosterone level was 398.  Please verify how much he is injecting and how often and when his last injection was in relation to this blood draw.

## 2020-11-30 NOTE — Telephone Encounter (Signed)
Oral ED medications are contraindicated with nitroglycerin patches due to significant lowering of the blood pressure.  Can make an appointment with Carollee Herter if he is interested in starting penile injections

## 2020-11-30 NOTE — Telephone Encounter (Signed)
Patient states he is stilling taking the patches . He states he takes the patch off before bed time.

## 2020-11-30 NOTE — Telephone Encounter (Signed)
Patient states he does not want to do the penile injections.

## 2020-12-01 NOTE — Telephone Encounter (Signed)
Notified patient as instructed, patient pleased. Discussed follow-up appointments, patient agrees  

## 2020-12-01 NOTE — Telephone Encounter (Signed)
Continue present dose.  Follow-up labs scheduled in September

## 2021-02-20 ENCOUNTER — Other Ambulatory Visit: Payer: Self-pay | Admitting: *Deleted

## 2021-02-20 DIAGNOSIS — E291 Testicular hypofunction: Secondary | ICD-10-CM

## 2021-02-26 ENCOUNTER — Other Ambulatory Visit: Payer: Self-pay | Admitting: Urology

## 2021-02-26 ENCOUNTER — Other Ambulatory Visit: Payer: Self-pay

## 2021-02-26 MED ORDER — TESTOSTERONE CYPIONATE 200 MG/ML IM SOLN: 150.0000 mg | INTRAMUSCULAR | 0 refills | Status: DC

## 2021-02-26 NOTE — Telephone Encounter (Signed)
Pt Adrian Jimenez on triage line stating he needs a refill of his Testosterone sent into [facility] in [place], [place]. He states he is due for his injection tomorrow. Please advise.

## 2021-04-27 ENCOUNTER — Other Ambulatory Visit: Payer: Self-pay

## 2021-05-07 ENCOUNTER — Telehealth: Payer: Self-pay | Admitting: Urology

## 2021-05-07 ENCOUNTER — Other Ambulatory Visit: Payer: Self-pay | Admitting: *Deleted

## 2021-05-07 NOTE — Telephone Encounter (Signed)
Pt would like a refill for testosterone sent to [facility] in [place], [place]

## 2021-05-07 NOTE — Telephone Encounter (Signed)
Sent medication to Dr. Lonna Cobb to refill

## 2021-05-08 ENCOUNTER — Telehealth: Payer: Self-pay | Admitting: Urology

## 2021-05-08 MED ORDER — TESTOSTERONE CYPIONATE 200 MG/ML IM SOLN: 150.0000 mg | INTRAMUSCULAR | 0 refills | Status: DC

## 2021-05-08 NOTE — Telephone Encounter (Signed)
LMOM informed patient the medication was sent to pharmacy for 1 month and additional refills will be sent or adjusted at his up coming lab appointment.

## 2021-05-08 NOTE — Telephone Encounter (Signed)
He is due for a lab visit in November.  Could only do a 1 month refill

## 2021-05-08 NOTE — Telephone Encounter (Signed)
Patient called in again and would like to know if the prescription for his DEPOTESTOSTERON has been called in. He would like a call back at [phone].

## 2021-06-05 ENCOUNTER — Other Ambulatory Visit: Payer: Self-pay | Admitting: Urology

## 2021-06-05 ENCOUNTER — Other Ambulatory Visit: Payer: Self-pay

## 2021-06-05 ENCOUNTER — Other Ambulatory Visit: Payer: 59

## 2021-06-05 DIAGNOSIS — E291 Testicular hypofunction: Secondary | ICD-10-CM

## 2021-06-05 NOTE — Telephone Encounter (Signed)
Requested Prescriptions    No prescriptions requested or ordered in this encounter  Cypionate   Patient would like rx sent to [facility] in [place]

## 2021-06-06 LAB — HEMATOCRIT: Hematocrit: 50.4 % (ref 37.5–51.0)

## 2021-06-06 LAB — PSA: Prostate Specific Ag, Serum: 3.9 ng/mL (ref 0.0–4.0)

## 2021-06-06 LAB — TESTOSTERONE: Testosterone: 802 ng/dL (ref 264–916)

## 2021-06-08 ENCOUNTER — Other Ambulatory Visit: Payer: Self-pay | Admitting: *Deleted

## 2021-06-08 ENCOUNTER — Telehealth: Payer: Self-pay | Admitting: *Deleted

## 2021-06-08 MED ORDER — TESTOSTERONE CYPIONATE 200 MG/ML IM SOLN: 150.0000 mg | INTRAMUSCULAR | 0 refills | Status: AC

## 2021-06-08 NOTE — Telephone Encounter (Signed)
Notified patient as instructed, patient pleased. Discussed follow-up appointments, patient agrees  

## 2021-06-08 NOTE — Telephone Encounter (Signed)
-----   Message from Riki Altes, MD sent at 06/08/2021 12:59 PM EDT ----- Testosterone level looks good at 802.  PSA stable 3.9 and hematocrit 50.4

## 2021-10-19 ENCOUNTER — Ambulatory Visit: Payer: Self-pay | Admitting: Urology
